# Patient Record
Sex: Male | Born: 1970 | Race: Black or African American | Hispanic: No | Marital: Married | Smoking: Never smoker
Health system: Southern US, Community
[De-identification: ages and names within clinical notes are randomized; demographics above are authoritative.]

## PROBLEM LIST (undated history)

## (undated) HISTORY — PX: FRACTURE SURGERY: SHX138

## (undated) HISTORY — PX: KNEE SURGERY: SHX244

---

## 1998-01-17 ENCOUNTER — Inpatient Hospital Stay (HOSPITAL_COMMUNITY): Admission: EM | Admit: 1998-01-17 | Discharge: 1998-01-18 | Payer: Self-pay | Admitting: Emergency Medicine

## 2009-05-13 ENCOUNTER — Emergency Department (HOSPITAL_COMMUNITY): Admission: EM | Admit: 2009-05-13 | Discharge: 2009-05-14 | Payer: Self-pay | Admitting: Emergency Medicine

## 2010-12-25 ENCOUNTER — Other Ambulatory Visit: Payer: Self-pay | Admitting: Family Medicine

## 2010-12-25 ENCOUNTER — Other Ambulatory Visit: Payer: Self-pay | Admitting: Emergency Medicine

## 2010-12-25 ENCOUNTER — Ambulatory Visit
Admission: RE | Admit: 2010-12-25 | Discharge: 2010-12-25 | Disposition: A | Discharge status: Home / Self Care | Payer: Self-pay | Source: Ambulatory Visit | Attending: Emergency Medicine | Admitting: Emergency Medicine

## 2010-12-25 DIAGNOSIS — M25521 Pain in right elbow: Secondary | ICD-10-CM

## 2010-12-25 DIAGNOSIS — M25562 Pain in left knee: Secondary | ICD-10-CM

## 2011-03-18 ENCOUNTER — Ambulatory Visit: Payer: Worker's Compensation | Attending: Sports Medicine | Admitting: Physical Therapy

## 2011-03-18 DIAGNOSIS — IMO0001 Reserved for inherently not codable concepts without codable children: Secondary | ICD-10-CM | POA: Insufficient documentation

## 2011-03-18 DIAGNOSIS — M25569 Pain in unspecified knee: Secondary | ICD-10-CM | POA: Insufficient documentation

## 2011-03-18 DIAGNOSIS — M25669 Stiffness of unspecified knee, not elsewhere classified: Secondary | ICD-10-CM | POA: Insufficient documentation

## 2011-03-23 ENCOUNTER — Ambulatory Visit: Payer: Worker's Compensation | Attending: Sports Medicine | Admitting: Physical Therapy

## 2011-03-23 DIAGNOSIS — IMO0001 Reserved for inherently not codable concepts without codable children: Secondary | ICD-10-CM | POA: Insufficient documentation

## 2011-03-23 DIAGNOSIS — M25569 Pain in unspecified knee: Secondary | ICD-10-CM | POA: Insufficient documentation

## 2011-03-23 DIAGNOSIS — M25669 Stiffness of unspecified knee, not elsewhere classified: Secondary | ICD-10-CM | POA: Insufficient documentation

## 2011-03-26 ENCOUNTER — Ambulatory Visit: Payer: Worker's Compensation | Admitting: Physical Therapy

## 2011-03-30 ENCOUNTER — Ambulatory Visit: Payer: Worker's Compensation | Admitting: Physical Therapy

## 2011-04-02 ENCOUNTER — Ambulatory Visit: Payer: Worker's Compensation | Admitting: Physical Therapy

## 2011-04-06 ENCOUNTER — Ambulatory Visit: Payer: Worker's Compensation | Admitting: Physical Therapy

## 2011-04-08 ENCOUNTER — Ambulatory Visit: Payer: Worker's Compensation | Admitting: Physical Therapy

## 2011-04-09 ENCOUNTER — Encounter: Payer: Self-pay | Admitting: Physical Therapy

## 2011-04-13 ENCOUNTER — Ambulatory Visit: Payer: Worker's Compensation | Admitting: Physical Therapy

## 2011-04-16 ENCOUNTER — Ambulatory Visit: Payer: Worker's Compensation | Attending: Sports Medicine | Admitting: Physical Therapy

## 2011-04-16 DIAGNOSIS — IMO0001 Reserved for inherently not codable concepts without codable children: Secondary | ICD-10-CM | POA: Insufficient documentation

## 2011-04-16 DIAGNOSIS — M25669 Stiffness of unspecified knee, not elsewhere classified: Secondary | ICD-10-CM | POA: Insufficient documentation

## 2011-04-16 DIAGNOSIS — M25569 Pain in unspecified knee: Secondary | ICD-10-CM | POA: Insufficient documentation

## 2011-04-19 ENCOUNTER — Ambulatory Visit: Payer: Worker's Compensation | Admitting: Physical Therapy

## 2011-04-22 ENCOUNTER — Ambulatory Visit: Payer: Worker's Compensation | Admitting: Physical Therapy

## 2011-04-27 ENCOUNTER — Encounter: Payer: Self-pay | Admitting: Physical Therapy

## 2011-04-29 ENCOUNTER — Encounter: Payer: Self-pay | Admitting: Physical Therapy

## 2012-05-08 ENCOUNTER — Emergency Department (HOSPITAL_COMMUNITY)
Admission: EM | Admit: 2012-05-08 | Discharge: 2012-05-08 | Disposition: A | Discharge status: Home / Self Care | Payer: Self-pay | Attending: Emergency Medicine | Admitting: Emergency Medicine

## 2012-05-08 ENCOUNTER — Encounter (HOSPITAL_COMMUNITY): Payer: Self-pay | Admitting: Emergency Medicine

## 2012-05-08 DIAGNOSIS — K089 Disorder of teeth and supporting structures, unspecified: Secondary | ICD-10-CM | POA: Insufficient documentation

## 2012-05-08 DIAGNOSIS — K0889 Other specified disorders of teeth and supporting structures: Secondary | ICD-10-CM

## 2012-05-08 MED ORDER — HYDROCODONE-ACETAMINOPHEN 5-325 MG PO TABS: ORAL_TABLET | ORAL | Status: DC

## 2012-05-08 MED ORDER — PENICILLIN V POTASSIUM 500 MG PO TABS: 500.0000 mg | ORAL_TABLET | Freq: Three times a day (TID) | ORAL | Status: DC

## 2012-05-08 NOTE — ED Notes (Signed)
Toothache x1 week.

## 2012-05-08 NOTE — ED Provider Notes (Signed)
History    This chart was scribed for Luis Black. Oletta Lamas, MD, MD by Smitty Pluck, ED Scribe. The patient was seen in room TR08C and the patient's care was started at 8:11AM.   CSN: [id number]  Arrival date & time 05/08/12  0757      Chief Complaint  Patient presents with  . Dental Pain    (Consider location/radiation/quality/duration/timing/severity/associated sxs/prior treatment) Patient is a 41 y.o. male presenting with tooth pain. The history is provided by the patient. No language interpreter was used.  Dental PainPrimary symptoms do not include fever or shortness of breath.   Tobias Avitabile is a 41 y.o. male who presents to the Emergency Department complaining of constant, moderate lower dental pain onset 1 month ago with pain worsening within the last week. Pt reports that he was eating and chipped his tooth. Pt reports air and water aggravates the pain. He denies fever, chills, nausea, vomiting and any other pain.    History reviewed. No pertinent past medical history.  Past Surgical History  Procedure Date  . Fracture surgery   . Knee surgery     History reviewed. No pertinent family history.  History  Substance Use Topics  . Smoking status: Never Smoker   . Smokeless tobacco: Not on file  . Alcohol Use: Yes     Comment: occasionally      Review of Systems  Constitutional: Negative for fever and chills.  HENT: Positive for dental problem.   Respiratory: Negative for shortness of breath.   Gastrointestinal: Negative for nausea.    Allergies  Review of patient's allergies indicates no known allergies.  Home Medications   Current Outpatient Rx  Name  Route  Sig  Dispense  Refill  . HYDROCODONE-ACETAMINOPHEN 5-325 MG PO TABS      1-2 tablets po q 6 hours prn moderate to severe pain   20 tablet   0   . PENICILLIN V POTASSIUM 500 MG PO TABS   Oral   Take 1 tablet (500 mg total) by mouth 3 (three) times daily.   30 tablet   0     BP 146/95  Pulse 60   Temp 98.8 F (37.1 C) (Oral)  Resp 16  SpO2 98%  Physical Exam  Nursing note and vitals reviewed. Constitutional: He is oriented to person, place, and time. He appears well-developed and well-nourished. No distress.  HENT:  Head: Normocephalic and atraumatic.  Mouth/Throat: Uvula is midline and oropharynx is clear and moist.         2nd molar in lower back has 3rd of tooth chipped free and the gumline is visible in the midline   Eyes: EOM are normal.  Neck: Neck supple.  Cardiovascular: Normal rate.   Pulmonary/Chest: Effort normal. No respiratory distress.  Musculoskeletal: Normal range of motion.  Neurological: He is alert and oriented to person, place, and time.  Skin: Skin is warm and dry.  Psychiatric: He has a normal mood and affect. His behavior is normal.    ED Course  Procedures (including critical care time) DIAGNOSTIC STUDIES: Oxygen Saturation is 98% on room air, normal by my interpretation.    COORDINATION OF CARE: 8:13 AM Discussed ED treatment with pt     Labs Reviewed - No data to display No results found.   1. Pain, dental       MDM  I personally performed the services described in this documentation, which was scribed in my presence. The recorded information has been reviewed and is  accurate.  Pt with cracked molar, likely some nerve exposure, pain.  No obv infection.  Will treat with PCN empirically and analgesics, referral made to Dr. Mayford Knife with adult dentistry.   Luis Black. Oletta Lamas, MD 05/08/12 1324

## 2012-05-08 NOTE — Discharge Instructions (Signed)
 Dental Pain A tooth ache may be caused by cavities (tooth decay). Cavities expose the nerve of the tooth to air and hot or cold temperatures. It may come from an infection or abscess (also called a boil or furuncle) around your tooth. It is also often caused by dental caries (tooth decay). This causes the pain you are having. DIAGNOSIS  Your caregiver can diagnose this problem by exam. TREATMENT   If caused by an infection, it may be treated with medications which kill germs (antibiotics) and pain medications as prescribed by your caregiver. Take medications as directed.  Only take over-the-counter or prescription medicines for pain, discomfort, or fever as directed by your caregiver.  Whether the tooth ache today is caused by infection or dental disease, you should see your dentist as soon as possible for further care. SEEK MEDICAL CARE IF: The exam and treatment you received today has been provided on an emergency basis only. This is not a substitute for complete medical or dental care. If your problem worsens or new problems (symptoms) appear, and you are unable to meet with your dentist, call or return to this location. SEEK IMMEDIATE MEDICAL CARE IF:   You have a fever.  You develop redness and swelling of your face, jaw, or neck.  You are unable to open your mouth.  You have severe pain uncontrolled by pain medicine. MAKE SURE YOU:   Understand these instructions.  Will watch your condition.  Will get help right away if you are not doing well or get worse. Document Released: 05/31/2005 Document Revised: 08/23/2011 Document Reviewed: 01/17/2008 [facility] Patient Information 2013 [place], [place].   Dental Care and Dentist Visits Dental care supports good overall health. Regular dental visits can also help you avoid dental pain, bleeding, infection, and other more serious health problems in the future. It is important to keep the mouth healthy because diseases in the teeth, gums,  and other oral tissues can spread to other areas of the body. Some problems, such as diabetes, heart disease, and pre-term labor have been associated with poor oral health.  See your dentist every 6 months. If you experience emergency problems such as a toothache or broken tooth, go to the dentist right away. If you see your dentist regularly, you may catch problems early. It is easier to be treated for problems in the early stages.  WHAT TO EXPECT AT A DENTIST VISIT  Your dentist will look for many common oral health problems and recommend proper treatment. At your regular dental visit, you can expect:  Gentle cleaning of the teeth and gums. This includes scraping and polishing. This helps to remove the sticky substance around the teeth and gums (plaque). Plaque forms in the mouth shortly after eating. Over time, plaque hardens on the teeth as tartar. If tartar is not removed regularly, it can cause problems. Cleaning also helps remove stains.  Periodic X-rays. These pictures of the teeth and supporting bone will help your dentist assess the health of your teeth.  Periodic fluoride treatments. Fluoride is a natural mineral shown to help strengthen teeth. Fluoride treatmentinvolves applying a fluoride gel or varnish to the teeth. It is most commonly done in children.  Examination of the mouth, tongue, jaws, teeth, and gums to look for any oral health problems, such as:  Cavities (dental caries). This is decay on the tooth caused by plaque, sugar, and acid in the mouth. It is best to catch a cavity when it is small.  Inflammation of the  gums caused by plaque buildup (gingivitis).  Problems with the mouth or malformed or misaligned teeth.  Oral cancer or other diseases of the soft tissues or jaws. KEEP YOUR TEETH AND GUMS HEALTHY For healthy teeth and gums, follow these general guidelines as well as your dentist's specific advice:  Have your teeth professionally cleaned at the dentist every 6  months.  Brush twice daily with a fluoride toothpaste.  Floss your teeth daily.  Ask your dentist if you need fluoride supplements, treatments, or fluoride toothpaste.  Eat a healthy diet. Reduce foods and drinks with added sugar.  Avoid smoking. TREATMENT FOR ORAL HEALTH PROBLEMS If you have oral health problems, treatment varies depending on the conditions present in your teeth and gums.  Your caregiver will most likely recommend good oral hygiene at each visit.  For cavities, gingivitis, or other oral health disease, your caregiver will perform a procedure to treat the problem. This is typically done at a separate appointment. Sometimes your caregiver will refer you to another dental specialist for specific tooth problems or for surgery. SEEK IMMEDIATE DENTAL CARE IF:  You have pain, bleeding, or soreness in the gum, tooth, jaw, or mouth area.  A permanent tooth becomes loose or separated from the gum socket.  You experience a blow or injury to the mouth or jaw area. Document Released: 02/10/2011 Document Revised: 08/23/2011 Document Reviewed: 02/10/2011 [facility] Patient Information 2013 [place], [place].   Narcotic and benzodiazepine use may cause drowsiness, slowed breathing or dependence.  Please use with caution and do not drive, operate machinery or watch young children alone while taking them.  Taking combinations of these medications or drinking alcohol will potentiate these effects.

## 2015-08-20 ENCOUNTER — Emergency Department (INDEPENDENT_AMBULATORY_CARE_PROVIDER_SITE_OTHER)
Admission: EM | Admit: 2015-08-20 | Discharge: 2015-08-20 | Disposition: A | Discharge status: Home / Self Care | Payer: Self-pay | Source: Home / Self Care | Attending: Family Medicine | Admitting: Family Medicine

## 2015-08-20 ENCOUNTER — Encounter (HOSPITAL_COMMUNITY): Payer: Self-pay | Admitting: *Deleted

## 2015-08-20 ENCOUNTER — Emergency Department (INDEPENDENT_AMBULATORY_CARE_PROVIDER_SITE_OTHER): Payer: Worker's Compensation

## 2015-08-20 DIAGNOSIS — M7531 Calcific tendinitis of right shoulder: Secondary | ICD-10-CM

## 2015-08-20 MED ORDER — NAPROXEN 500 MG PO TABS: 500.0000 mg | ORAL_TABLET | Freq: Two times a day (BID) | ORAL | Status: DC

## 2015-08-20 NOTE — Discharge Instructions (Signed)
Wear sling for comfort, ice and medicine until you see orthopedist for further care.

## 2015-08-20 NOTE — ED Notes (Signed)
Pt  Reports    He   Has  r  Shoulder    Pain     And   He      denys  Any  Injury  He  States  He  Woke  Up  With the  Pain    Sunday  Am     And  He is  In pain  And  His  bp  Is   High

## 2015-08-20 NOTE — ED Provider Notes (Signed)
CSN: [id number]     Arrival date & time 08/20/15  1858 History   None    Chief Complaint  Patient presents with  . Shoulder Pain   (Consider location/radiation/quality/duration/timing/severity/associated sxs/prior Treatment) Patient is a 45 y.o. male presenting with shoulder pain. The history is provided by the patient.  Shoulder Pain Location:  Shoulder Time since incident:  3 days Injury: no   Shoulder location:  R shoulder Pain details:    Quality:  Sharp   Radiates to:  Does not radiate   Severity:  Moderate   Onset quality:  Sudden Chronicity:  New Dislocation: no   Prior injury to area:  No Relieved by:  Nothing Worsened by:  Nothing tried Associated symptoms: decreased range of motion and stiffness   Associated symptoms: no back pain, no fever and no neck pain     History reviewed. No pertinent past medical history. Past Surgical History  Procedure Laterality Date  . Fracture surgery    . Knee surgery     History reviewed. No pertinent family history. Social History  Substance Use Topics  . Smoking status: Never Smoker   . Smokeless tobacco: None  . Alcohol Use: Yes     Comment: occasionally    Review of Systems  Constitutional: Negative.  Negative for fever.  Musculoskeletal: Positive for joint swelling and stiffness. Negative for back pain, gait problem and neck pain.  Skin: Negative.   All other systems reviewed and are negative.   Allergies  Review of patient's allergies indicates no known allergies.  Home Medications   Prior to Admission medications   Medication Sig Start Date End Date Taking? Authorizing Provider  HYDROcodone-acetaminophen (NORCO/VICODIN) 5-325 MG per tablet 1-2 tablets po q 6 hours prn moderate to severe pain 05/08/12   Quita SkyeMichael Ghim, MD  naproxen (NAPROSYN) 500 MG tablet Take 1 tablet (500 mg total) by mouth 2 (two) times daily with a meal. 08/20/15   Linna HoffJames D Lorrinda Ramstad, MD  penicillin v potassium (VEETID) 500 MG tablet Take 1 tablet  (500 mg total) by mouth 3 (three) times daily. 05/08/12   Quita SkyeMichael Ghim, MD   Meds Ordered and Administered this Visit  Medications - No data to display  There were no vitals taken for this visit. No data found.   Physical Exam  Constitutional: He is oriented to person, place, and time. He appears well-developed and well-nourished. No distress.  Musculoskeletal: He exhibits tenderness.       Right shoulder: He exhibits decreased range of motion, tenderness, bony tenderness, swelling, pain and decreased strength. He exhibits no effusion, no crepitus, no deformity and normal pulse.       Right elbow: Normal.      Right hand: Normal.       Hands: Neurological: He is alert and oriented to person, place, and time.  Skin: Skin is warm and dry.  Nursing note and vitals reviewed.   ED Course  Procedures (including critical care time)  Labs Review Labs Reviewed - No data to display  Imaging Review Dg Shoulder Right  08/20/2015  CLINICAL DATA:  Right shoulder pain and limited range of motion. No known injury. Numbness and tingling down right arm to fingers. EXAM: RIGHT SHOULDER - 2+ VIEW COMPARISON:  None. FINDINGS: Patient not position for axillary view due to pain. No acute fracture or dislocation. Moderate proliferative change at the acromioclavicular joint. Small glenoid osteophytes. Ossification in the region of the coracoclavicular ligament insertion, suggesting enthesopathy or remote prior injury. 12 mm  soft tissue calcification adjacent to the greater tuberosity. IMPRESSION: 1. Soft tissue calcification adjacent to the greater tuberosity, suggesting calcific tendinopathy or bursitis. 2. Degenerative change at the acromioclavicular and glenohumeral joints, no acute bony abnormality. Electronically Signed   By: Rubye Oaks M.D.   On: 08/20/2015 20:36     Visual Acuity Review  Right Eye Distance:   Left Eye Distance:   Bilateral Distance:    Right Eye Near:   Left Eye Near:      Bilateral Near:         MDM   1. Tendonitis, calcific, shoulder, right        Linna Hoff, MD 08/20/15 2054

## 2016-04-30 ENCOUNTER — Emergency Department (HOSPITAL_COMMUNITY)
Admission: EM | Admit: 2016-04-30 | Discharge: 2016-04-30 | Disposition: A | Discharge status: Home / Self Care | Payer: No Typology Code available for payment source | Attending: Emergency Medicine | Admitting: Emergency Medicine

## 2016-04-30 ENCOUNTER — Emergency Department (HOSPITAL_COMMUNITY): Payer: No Typology Code available for payment source

## 2016-04-30 ENCOUNTER — Encounter (HOSPITAL_COMMUNITY): Payer: Self-pay

## 2016-04-30 DIAGNOSIS — Y92410 Unspecified street and highway as the place of occurrence of the external cause: Secondary | ICD-10-CM | POA: Insufficient documentation

## 2016-04-30 DIAGNOSIS — S0990XA Unspecified injury of head, initial encounter: Secondary | ICD-10-CM | POA: Diagnosis present

## 2016-04-30 DIAGNOSIS — M546 Pain in thoracic spine: Secondary | ICD-10-CM | POA: Diagnosis not present

## 2016-04-30 DIAGNOSIS — Y999 Unspecified external cause status: Secondary | ICD-10-CM | POA: Insufficient documentation

## 2016-04-30 DIAGNOSIS — S0001XA Abrasion of scalp, initial encounter: Secondary | ICD-10-CM | POA: Insufficient documentation

## 2016-04-30 DIAGNOSIS — Y939 Activity, unspecified: Secondary | ICD-10-CM | POA: Insufficient documentation

## 2016-04-30 MED ORDER — METHOCARBAMOL 500 MG PO TABS: 500.0000 mg | ORAL_TABLET | Freq: Three times a day (TID) | ORAL | 0 refills | Status: AC | PRN

## 2016-04-30 MED ORDER — KETOROLAC TROMETHAMINE 60 MG/2ML IM SOLN
30.0000 mg | Freq: Once | INTRAMUSCULAR | Status: AC
  Administered 2016-04-30: 30 mg via INTRAMUSCULAR
  Filled 2016-04-30: qty 2

## 2016-04-30 MED ORDER — IBUPROFEN 600 MG PO TABS: 600.0000 mg | ORAL_TABLET | Freq: Four times a day (QID) | ORAL | 0 refills | Status: AC | PRN

## 2016-04-30 MED ORDER — METHOCARBAMOL 500 MG PO TABS
500.0000 mg | ORAL_TABLET | Freq: Once | ORAL | Status: AC
  Administered 2016-04-30: 500 mg via ORAL
  Filled 2016-04-30: qty 1
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
## 2016-09-30 IMAGING — DX DG SHOULDER 2+V*R*
3 series · 3 of 3 positions shown · non-contrast
Comparison: None.

CLINICAL DATA: Right shoulder pain and limited range of motion. No
known injury. Numbness and tingling down right arm to fingers.

EXAM:
RIGHT SHOULDER - 2+ VIEW

[shoulder ap (1 of 2)]
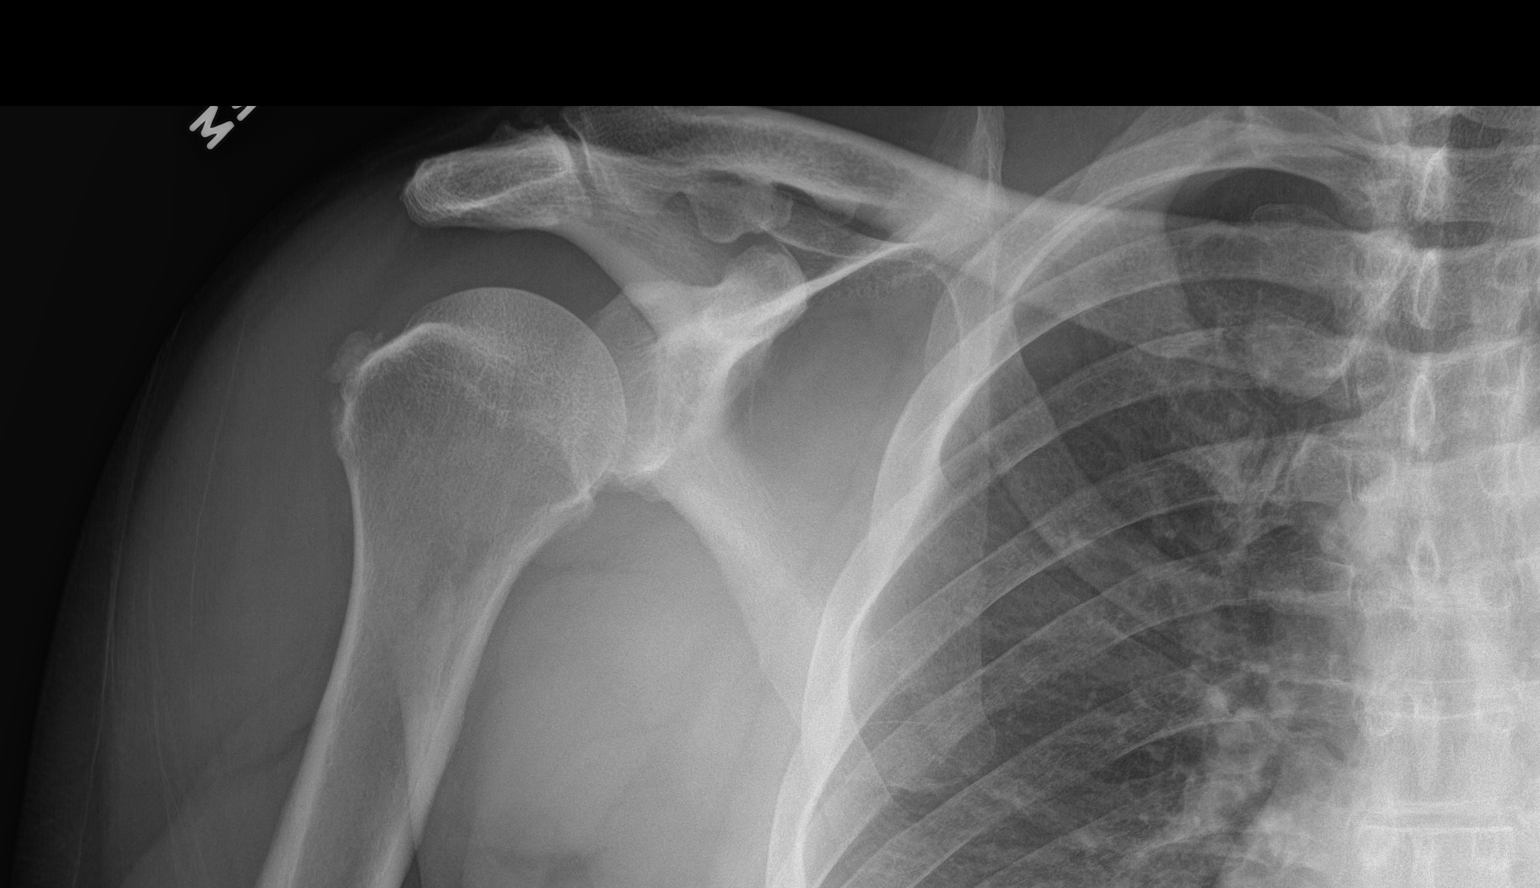

[shoulder ap (2 of 2)]
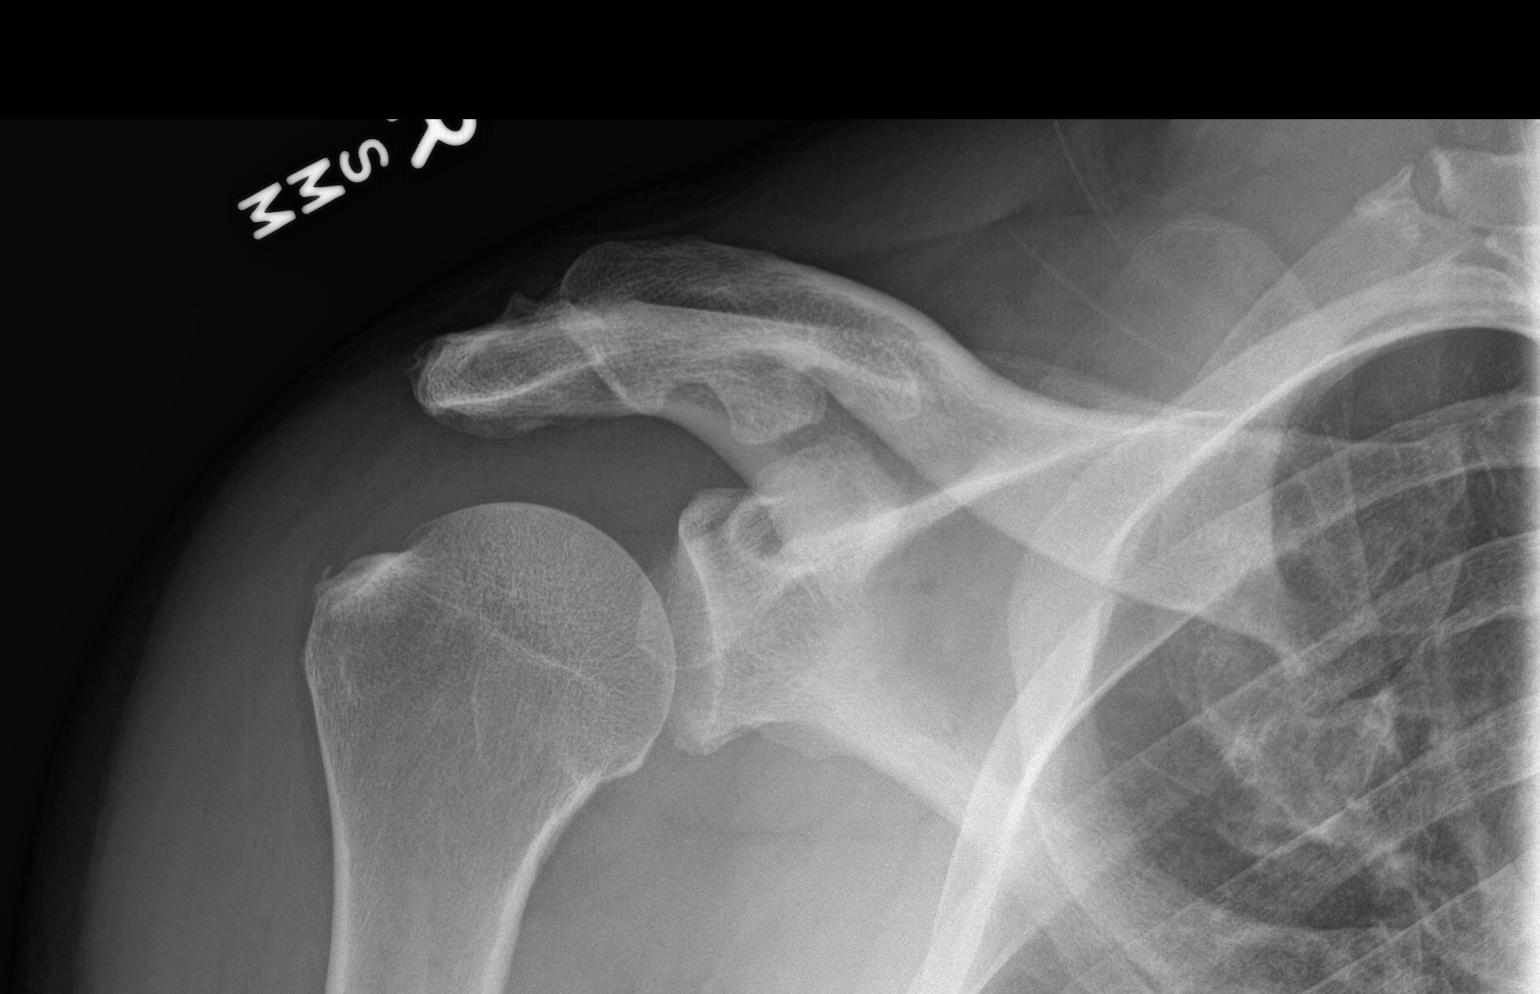

[shoulder y view]
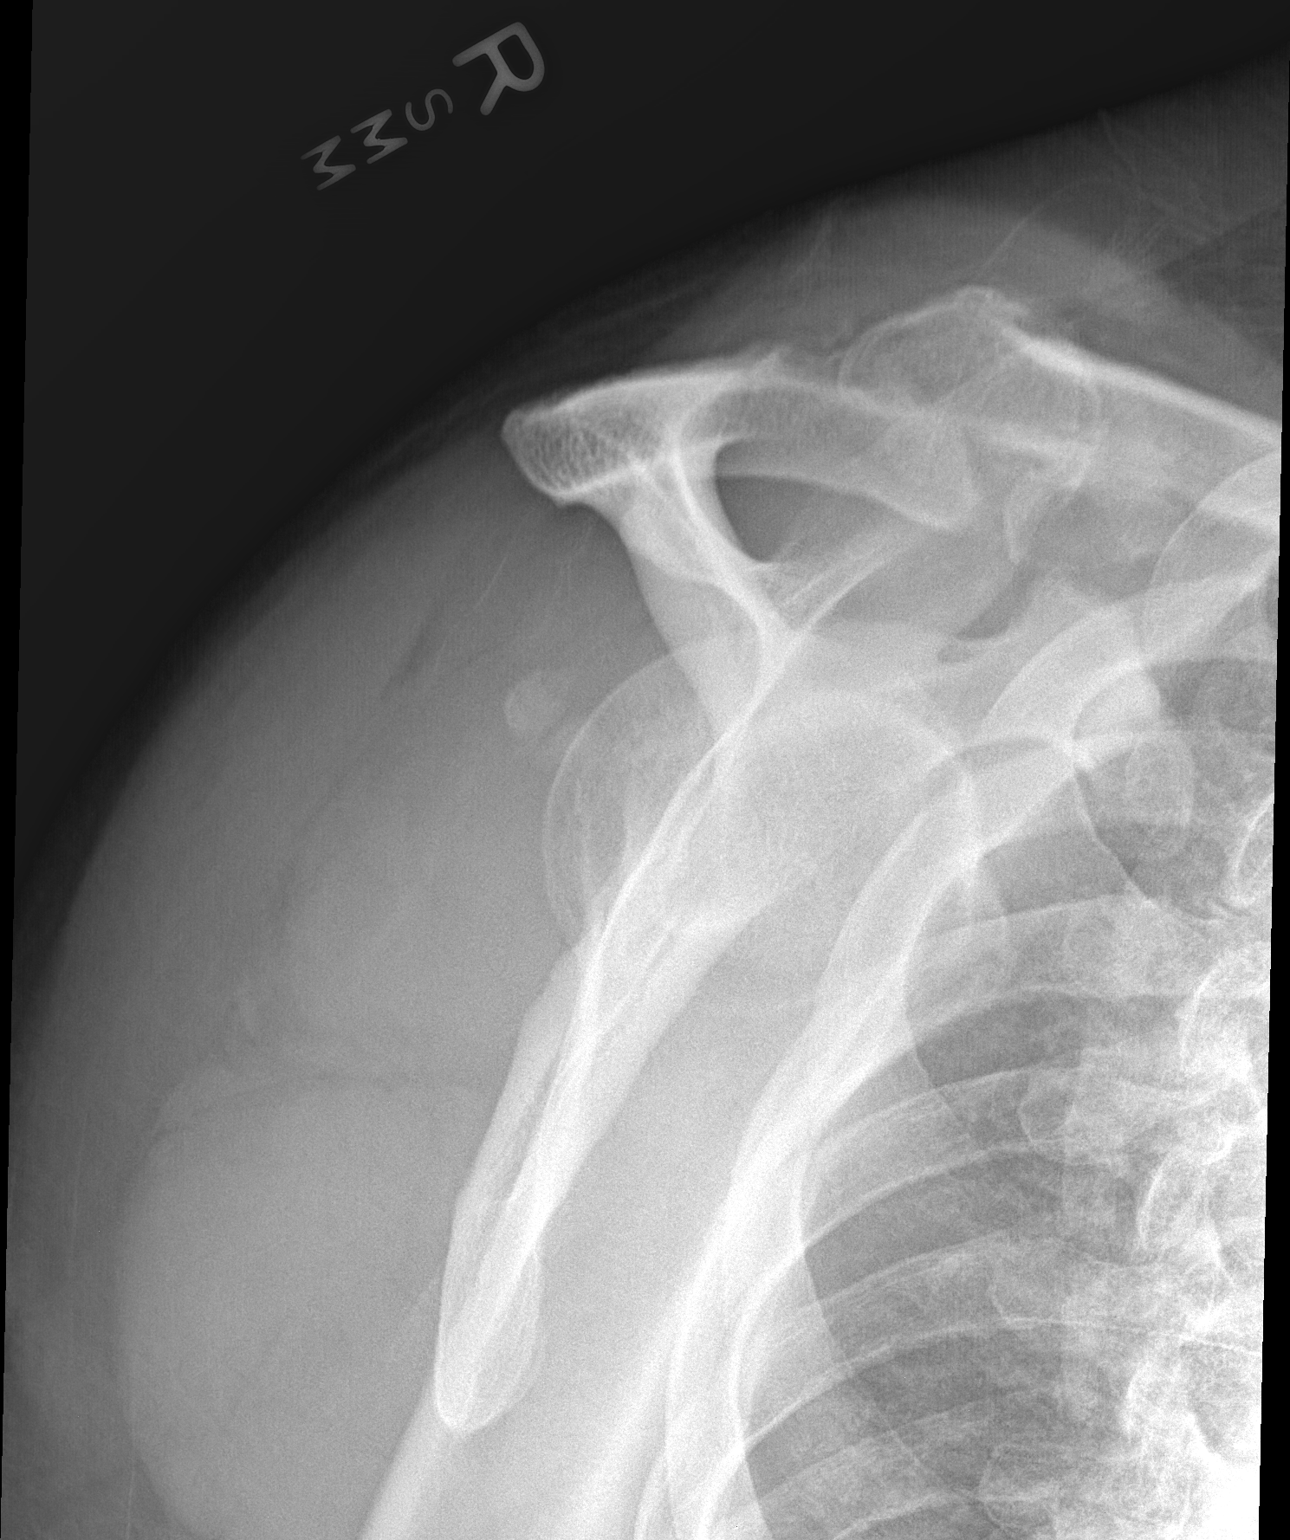

[3 of 3 positions shown; findings below may reference images not displayed]

FINDINGS: Patient not position for axillary view due to pain. No acute
fracture or dislocation. Moderate proliferative change at the
acromioclavicular joint. Small glenoid osteophytes. Ossification in
the region of the coracoclavicular ligament insertion, suggesting
enthesopathy or remote prior injury. 12 mm soft tissue calcification
adjacent to the greater tuberosity.
IMPRESSION: 1. Soft tissue calcification adjacent to the greater tuberosity,
suggesting calcific tendinopathy or bursitis.
2. Degenerative change at the acromioclavicular and glenohumeral
joints, no acute bony abnormality.

## 2017-06-11 IMAGING — CR DG CHEST 2V
2 series · 2 of 2 positions shown · non-contrast
Comparison: None.

CLINICAL DATA: Left posterior shoulder and flank pain today.

EXAM:
CHEST  2 VIEW

[chest pa]
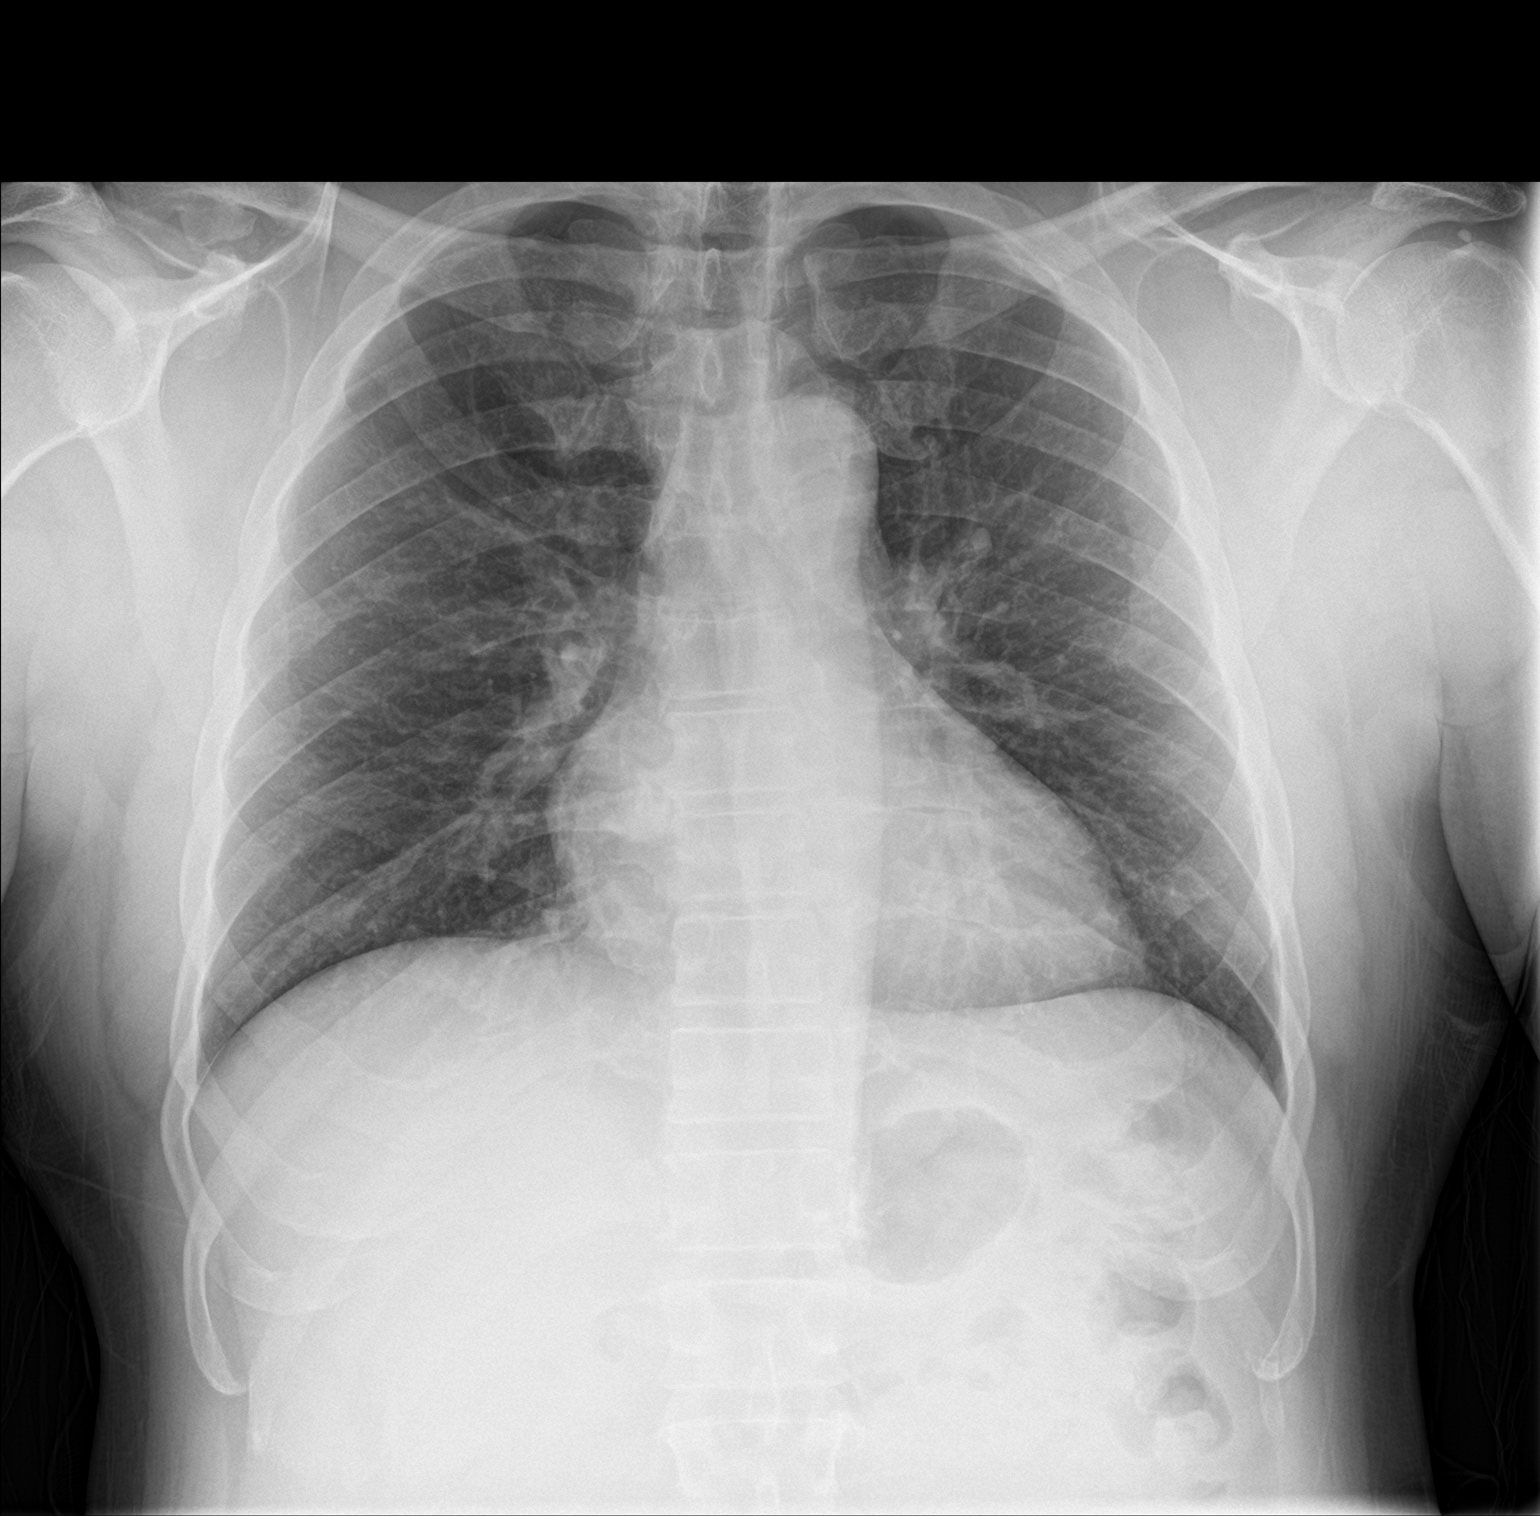

[chest lat]
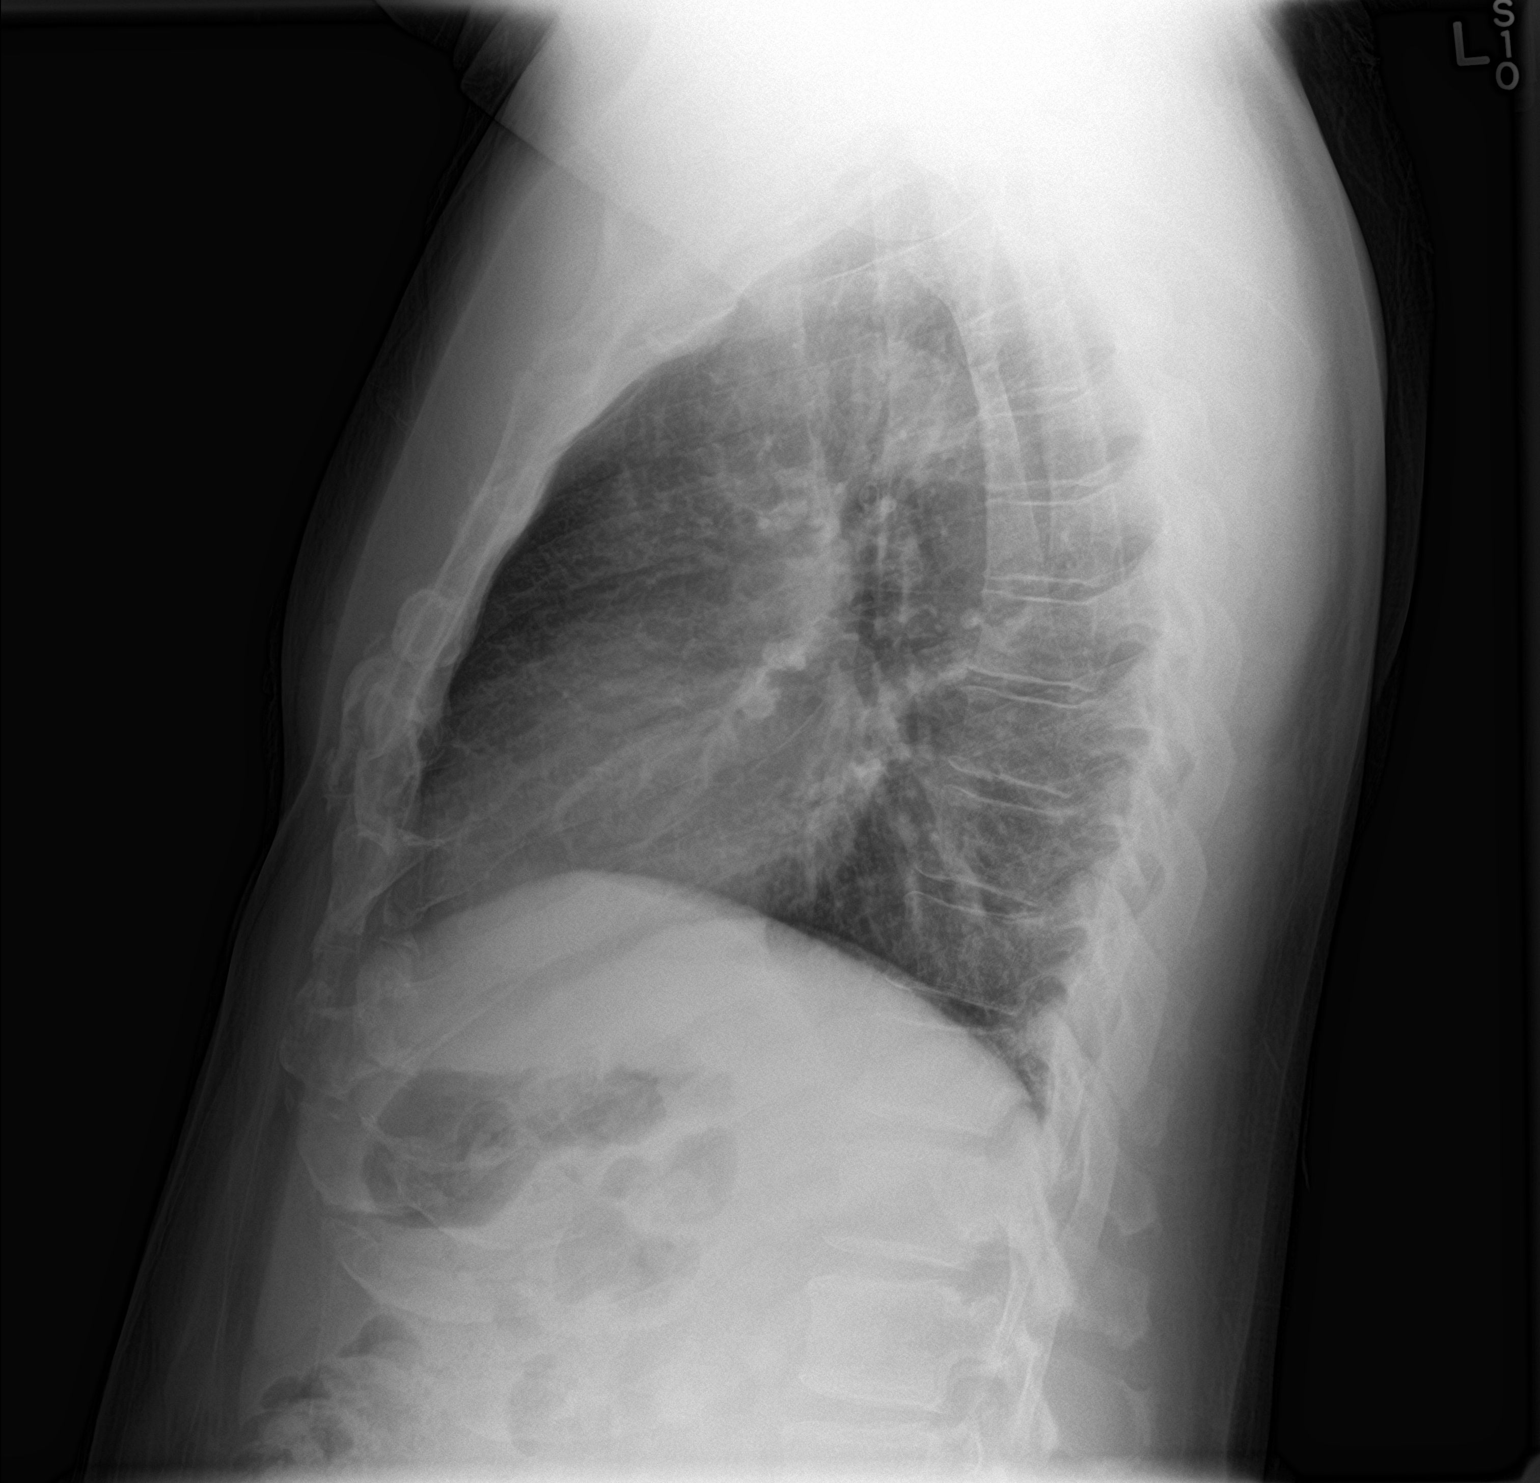

[2 of 2 positions shown; findings below may reference images not displayed]

FINDINGS: There is abnormal expansion of the anterior aspect of the right
third rib. The other bones appear normal.

There is calcification in the rotator cuff of the left shoulder.

There is evidence of a remote prior AC joint separation on the
right.

Heart size and pulmonary vascularity are normal.  Lungs are clear.
IMPRESSION: 1. Abnormal expansion of the anterior aspect of the right third rib,
nonspecific. This could represent fibrous dysplasia of but I cannot
exclude some other expansile bone tumor.
2. Calcific tendinopathy of the left shoulder.
3. Old posttraumatic changes of the right shoulder.
